# Patient Record
Sex: Male | Born: 1995 | Race: White | Hispanic: No | Marital: Single | Smoking: Never smoker
Health system: Southern US, Community
[De-identification: ages and names within clinical notes are randomized; demographics above are authoritative.]

---
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
## 2022-10-26 ENCOUNTER — Emergency Department
Admission: EM | Admit: 2022-10-26 | Discharge: 2022-10-26 | Disposition: A | Discharge status: Home / Self Care | Payer: Medicaid Other | Attending: Emergency Medicine | Admitting: Emergency Medicine

## 2022-10-26 ENCOUNTER — Other Ambulatory Visit: Payer: Self-pay

## 2022-10-26 DIAGNOSIS — K029 Dental caries, unspecified: Secondary | ICD-10-CM | POA: Diagnosis not present

## 2022-10-26 DIAGNOSIS — K0889 Other specified disorders of teeth and supporting structures: Secondary | ICD-10-CM | POA: Diagnosis present

## 2022-10-26 MED ORDER — AMOXICILLIN 875 MG PO TABS: 875.0000 mg | ORAL_TABLET | Freq: Two times a day (BID) | ORAL | 0 refills | Status: AC

## 2022-10-26 NOTE — ED Notes (Signed)
C/O ongoing left lower jaw dental pain.  Has been seen through the ED in the past and given antibiotics.  States pain improved after taking the antibiotics, but has steadily returned.  Patient states he has not followed up with a dentist, but now has insurance.  AAOx3.  Skin warm and dry. NAD

## 2022-10-26 NOTE — ED Triage Notes (Signed)
Pt to ED via POV from home. Pt reports he has been having increasing dental pain on the left side. Pt reports also may have tonsillitis.

## 2022-10-26 NOTE — ED Provider Notes (Signed)
[facility] Provider Note    Event Date/Time   First MD Initiated Contact with Patient 10/26/22 1028     (approximate)   History   Dental Pain   HPI  Levi Mcclure is a 27 y.o. male   presents to the ED with complaint of dental pain for approximately 1 week this specific time and also that his tonsils hurt.  He denies any fever.  He states that his mother got an appointment with a dentist for next Tuesday.  He denies medical history or allergies to medications.      Physical Exam   Triage Vital Signs: ED Triage Vitals  Encounter Vitals Group     BP 10/26/22 0941 (!) 135/90     Systolic BP Percentile --      Diastolic BP Percentile --      Pulse Rate 10/26/22 0941 (!) 56     Resp 10/26/22 0941 18     Temp 10/26/22 0941 97.7 F (36.5 C)     Temp src --      SpO2 10/26/22 0941 100 %     Weight --      Height --      Head Circumference --      Peak Flow --      Pain Score 10/26/22 0940 10     Pain Loc --      Pain Education --      Exclude from Growth Chart --     Most recent vital signs: Vitals:   10/26/22 0941  BP: (!) 135/90  Pulse: (!) 56  Resp: 18  Temp: 97.7 F (36.5 C)  SpO2: 100%     General: Awake, no distress.  CV:  Good peripheral perfusion.  Resp:  Normal effort.  Abd:  No distention.  Other:  Left lower molars with caries present.  No gum edema or drainage present.  Neck is supple without cervical lymphadenopathy.  No erythema or exudate noted the tonsillar area.   ED Results / Procedures / Treatments   Labs (all labs ordered are listed, but only abnormal results are displayed) Labs Reviewed - No data to display    PROCEDURES:  Critical Care performed:   Procedures   MEDICATIONS ORDERED IN ED: Medications - No data to display   IMPRESSION / MDM / ASSESSMENT AND PLAN / ED COURSE  I reviewed the triage vital signs and the nursing notes.   Differential diagnosis includes, but is not limited to,  pharyngitis, tonsillitis, strep pharyngitis, dental caries, dental abscess, gingivitis.  27 year old male presents to the ED with complaint of dental pain.  Patient reports that he has a dental appointment next week.  A prescription for amoxicillin 875 twice daily was sent to the pharmacy for him to begin taking and he is also instructed to take Tylenol or ibuprofen as needed for pain.      Patient's presentation is most consistent with acute, uncomplicated illness.  FINAL CLINICAL IMPRESSION(S) / ED DIAGNOSES   Final diagnoses:  Pain due to dental caries     Rx / DC Orders   ED Discharge Orders          Ordered    amoxicillin (AMOXIL) 875 MG tablet  2 times daily        10/26/22 1037             Note:  This document was prepared using Dragon voice recognition software and may include unintentional dictation errors.  Tommi Rumps, PA-C 10/26/22 1158    Sharyn Creamer, MD 10/27/22 1110

## 2022-10-26 NOTE — Discharge Instructions (Signed)
Begin taking amoxicillin 875 twice daily until completely finished.  You may take Tylenol or ibuprofen with this medication if needed for pain.  Keep your appointment with your dentist next week.  Avoid hot or cold liquids or foods which may make your teeth hurt worse.
